# Patient Record
Sex: Male | Born: 1980 | Hispanic: Yes | Marital: Married
Health system: Southern US, Community
[De-identification: ages and names within clinical notes are randomized; demographics above are authoritative.]

---

## 2019-08-22 ENCOUNTER — Emergency Department (HOSPITAL_COMMUNITY)
Admission: EM | Admit: 2019-08-22 | Discharge: 2019-08-22 | Disposition: A | Discharge status: Home / Self Care | Payer: Self-pay | Attending: Emergency Medicine | Admitting: Emergency Medicine

## 2019-08-22 ENCOUNTER — Emergency Department (HOSPITAL_COMMUNITY): Payer: Self-pay

## 2019-08-22 ENCOUNTER — Other Ambulatory Visit: Payer: Self-pay

## 2019-08-22 ENCOUNTER — Encounter (HOSPITAL_COMMUNITY): Payer: Self-pay | Admitting: Emergency Medicine

## 2019-08-22 DIAGNOSIS — S31139A Puncture wound of abdominal wall without foreign body, unspecified quadrant without penetration into peritoneal cavity, initial encounter: Secondary | ICD-10-CM | POA: Insufficient documentation

## 2019-08-22 DIAGNOSIS — R109 Unspecified abdominal pain: Secondary | ICD-10-CM | POA: Insufficient documentation

## 2019-08-22 DIAGNOSIS — Y929 Unspecified place or not applicable: Secondary | ICD-10-CM | POA: Insufficient documentation

## 2019-08-22 DIAGNOSIS — Z23 Encounter for immunization: Secondary | ICD-10-CM | POA: Insufficient documentation

## 2019-08-22 DIAGNOSIS — Y939 Activity, unspecified: Secondary | ICD-10-CM | POA: Insufficient documentation

## 2019-08-22 DIAGNOSIS — W3400XA Accidental discharge from unspecified firearms or gun, initial encounter: Secondary | ICD-10-CM

## 2019-08-22 DIAGNOSIS — Y999 Unspecified external cause status: Secondary | ICD-10-CM | POA: Insufficient documentation

## 2019-08-22 LAB — URINALYSIS, ROUTINE W REFLEX MICROSCOPIC
Bacteria, UA: NONE SEEN
Bilirubin Urine: NEGATIVE
Glucose, UA: NEGATIVE mg/dL
Ketones, ur: NEGATIVE mg/dL
Leukocytes,Ua: NEGATIVE
Nitrite: NEGATIVE
Protein, ur: NEGATIVE mg/dL
Specific Gravity, Urine: 1.024 (ref 1.005–1.030)
pH: 6 (ref 5.0–8.0)

## 2019-08-22 LAB — CBC
HCT: 49.6 % (ref 39.0–52.0)
Hemoglobin: 16.6 g/dL (ref 13.0–17.0)
MCH: 29.3 pg (ref 26.0–34.0)
MCHC: 33.5 g/dL (ref 30.0–36.0)
MCV: 87.6 fL (ref 80.0–100.0)
Platelets: 291 10*3/uL (ref 150–400)
RBC: 5.66 MIL/uL (ref 4.22–5.81)
RDW: 12.3 % (ref 11.5–15.5)
WBC: 9.5 10*3/uL (ref 4.0–10.5)
nRBC: 0 % (ref 0.0–0.2)

## 2019-08-22 LAB — COMPREHENSIVE METABOLIC PANEL
ALT: 37 U/L (ref 0–44)
AST: 39 U/L (ref 15–41)
Albumin: 4.3 g/dL (ref 3.5–5.0)
Alkaline Phosphatase: 74 U/L (ref 38–126)
Anion gap: 11 (ref 5–15)
BUN: 15 mg/dL (ref 6–20)
CO2: 22 mmol/L (ref 22–32)
Calcium: 9.2 mg/dL (ref 8.9–10.3)
Chloride: 103 mmol/L (ref 98–111)
Creatinine, Ser: 1.19 mg/dL (ref 0.61–1.24)
GFR calc Af Amer: >60 mL/min (ref >60)
GFR calc non Af Amer: >60 mL/min (ref >60)
Glucose, Bld: 103 mg/dL — ABNORMAL HIGH (ref 70–99)
Potassium: 4.7 mmol/L (ref 3.5–5.1)
Sodium: 136 mmol/L (ref 135–145)
Total Bilirubin: 1.2 mg/dL (ref 0.3–1.2)
Total Protein: 7 g/dL (ref 6.5–8.1)

## 2019-08-22 LAB — I-STAT CHEM 8, ED
BUN: 19 mg/dL (ref 6–20)
Calcium, Ion: 1.08 mmol/L — ABNORMAL LOW (ref 1.15–1.40)
Chloride: 102 mmol/L (ref 98–111)
Creatinine, Ser: 1.1 mg/dL (ref 0.61–1.24)
Glucose, Bld: 98 mg/dL (ref 70–99)
HCT: 50 % (ref 39.0–52.0)
Hemoglobin: 17 g/dL (ref 13.0–17.0)
Potassium: 4.7 mmol/L (ref 3.5–5.1)
Sodium: 137 mmol/L (ref 135–145)
TCO2: 26 mmol/L (ref 22–32)

## 2019-08-22 LAB — CDS SEROLOGY

## 2019-08-22 LAB — LACTIC ACID, PLASMA: Lactic Acid, Venous: 3.7 mmol/L (ref 0.5–1.9)

## 2019-08-22 LAB — ETHANOL: Alcohol, Ethyl (B): <10 mg/dL (ref <10)

## 2019-08-22 LAB — SAMPLE TO BLOOD BANK

## 2019-08-22 LAB — PROTIME-INR
INR: 1 (ref 0.8–1.2)
Prothrombin Time: 12.6 seconds (ref 11.4–15.2)

## 2019-08-22 MED ORDER — IOHEXOL 300 MG/ML  SOLN
100.0000 mL | Freq: Once | INTRAMUSCULAR | Status: AC | PRN
  Administered 2019-08-22: 100 mL via INTRAVENOUS

## 2019-08-22 MED ORDER — TETANUS-DIPHTH-ACELL PERTUSSIS 5-2.5-18.5 LF-MCG/0.5 IM SUSP
0.5000 mL | Freq: Once | INTRAMUSCULAR | Status: AC
  Administered 2019-08-22: 0.5 mL via INTRAMUSCULAR
  Filled 2019-08-22: qty 0.5
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
## 2019-08-22 NOTE — ED Notes (Signed)
Bilateral hands bagged with brown paper bags.

## 2019-08-22 NOTE — ED Notes (Signed)
Help get patient undressed into a gown on the monitor patient is resting with nurses at bedside

## 2019-08-22 NOTE — ED Notes (Signed)
Patient transported to CT 
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
